# Patient Record
Sex: Female | Born: 1950 | Race: White | Hispanic: No | Marital: Married
Health system: Southern US, Community
[De-identification: ages and names within clinical notes are randomized; demographics above are authoritative.]

---

## 2007-11-14 ENCOUNTER — Encounter: Admission: RE | Admit: 2007-11-14 | Discharge: 2007-11-14 | Payer: Self-pay | Admitting: Family Medicine

## 2007-11-21 ENCOUNTER — Ambulatory Visit (HOSPITAL_COMMUNITY): Admission: RE | Admit: 2007-11-21 | Discharge: 2007-11-21 | Payer: Self-pay | Admitting: Family Medicine

## 2008-11-21 ENCOUNTER — Ambulatory Visit (HOSPITAL_COMMUNITY): Admission: RE | Admit: 2008-11-21 | Discharge: 2008-11-21 | Payer: Self-pay | Admitting: Family Medicine

## 2009-06-12 ENCOUNTER — Encounter: Admission: RE | Admit: 2009-06-12 | Discharge: 2009-06-12 | Payer: Self-pay | Admitting: Family Medicine

## 2009-11-25 ENCOUNTER — Ambulatory Visit (HOSPITAL_COMMUNITY): Admission: RE | Admit: 2009-11-25 | Discharge: 2009-11-25 | Payer: Self-pay | Admitting: Family Medicine

## 2009-12-01 ENCOUNTER — Encounter: Admission: RE | Admit: 2009-12-01 | Discharge: 2009-12-01 | Payer: Self-pay | Admitting: Family Medicine

## 2010-06-13 ENCOUNTER — Encounter: Payer: Self-pay | Admitting: Family Medicine

## 2010-10-29 ENCOUNTER — Other Ambulatory Visit (HOSPITAL_COMMUNITY): Payer: Self-pay | Admitting: Family Medicine

## 2010-10-29 DIAGNOSIS — Z1231 Encounter for screening mammogram for malignant neoplasm of breast: Secondary | ICD-10-CM

## 2010-11-29 ENCOUNTER — Ambulatory Visit (HOSPITAL_COMMUNITY)
Admission: RE | Admit: 2010-11-29 | Discharge: 2010-11-29 | Disposition: A | Discharge status: Home / Self Care | Source: Ambulatory Visit | Attending: Family Medicine | Admitting: Family Medicine

## 2010-11-29 DIAGNOSIS — Z1231 Encounter for screening mammogram for malignant neoplasm of breast: Secondary | ICD-10-CM | POA: Insufficient documentation

## 2011-10-24 ENCOUNTER — Other Ambulatory Visit (HOSPITAL_COMMUNITY): Payer: Self-pay | Admitting: Family Medicine

## 2011-10-24 DIAGNOSIS — Z1231 Encounter for screening mammogram for malignant neoplasm of breast: Secondary | ICD-10-CM

## 2011-12-01 ENCOUNTER — Ambulatory Visit (HOSPITAL_COMMUNITY)
Admission: RE | Admit: 2011-12-01 | Discharge: 2011-12-01 | Disposition: A | Discharge status: Home / Self Care | Source: Ambulatory Visit | Attending: Family Medicine | Admitting: Family Medicine

## 2011-12-01 DIAGNOSIS — Z1231 Encounter for screening mammogram for malignant neoplasm of breast: Secondary | ICD-10-CM | POA: Insufficient documentation

## 2011-12-07 ENCOUNTER — Other Ambulatory Visit: Payer: Self-pay | Admitting: Family Medicine

## 2011-12-07 DIAGNOSIS — R928 Other abnormal and inconclusive findings on diagnostic imaging of breast: Secondary | ICD-10-CM

## 2011-12-12 ENCOUNTER — Ambulatory Visit
Admission: RE | Admit: 2011-12-12 | Discharge: 2011-12-12 | Disposition: A | Discharge status: Home / Self Care | Source: Ambulatory Visit | Attending: Family Medicine | Admitting: Family Medicine

## 2011-12-12 ENCOUNTER — Other Ambulatory Visit: Payer: Self-pay | Admitting: Family Medicine

## 2011-12-12 DIAGNOSIS — R928 Other abnormal and inconclusive findings on diagnostic imaging of breast: Secondary | ICD-10-CM

## 2011-12-13 ENCOUNTER — Ambulatory Visit
Admission: RE | Admit: 2011-12-13 | Discharge: 2011-12-13 | Disposition: A | Discharge status: Home / Self Care | Source: Ambulatory Visit | Attending: Family Medicine | Admitting: Family Medicine

## 2011-12-13 DIAGNOSIS — R928 Other abnormal and inconclusive findings on diagnostic imaging of breast: Secondary | ICD-10-CM

## 2012-10-23 ENCOUNTER — Other Ambulatory Visit (HOSPITAL_COMMUNITY): Payer: Self-pay | Admitting: Family Medicine

## 2012-10-23 DIAGNOSIS — Z1231 Encounter for screening mammogram for malignant neoplasm of breast: Secondary | ICD-10-CM

## 2012-12-07 ENCOUNTER — Ambulatory Visit (HOSPITAL_COMMUNITY)

## 2012-12-14 ENCOUNTER — Ambulatory Visit (HOSPITAL_COMMUNITY)
Admission: RE | Admit: 2012-12-14 | Discharge: 2012-12-14 | Disposition: A | Discharge status: Home / Self Care | Source: Ambulatory Visit | Attending: Family Medicine | Admitting: Family Medicine

## 2012-12-14 ENCOUNTER — Other Ambulatory Visit (HOSPITAL_COMMUNITY): Payer: Self-pay | Admitting: Family Medicine

## 2012-12-14 DIAGNOSIS — Z1231 Encounter for screening mammogram for malignant neoplasm of breast: Secondary | ICD-10-CM

## 2013-11-12 ENCOUNTER — Other Ambulatory Visit (HOSPITAL_COMMUNITY): Payer: Self-pay | Admitting: Family Medicine

## 2013-11-12 DIAGNOSIS — Z1231 Encounter for screening mammogram for malignant neoplasm of breast: Secondary | ICD-10-CM

## 2013-12-16 ENCOUNTER — Ambulatory Visit (HOSPITAL_COMMUNITY)
Admission: RE | Admit: 2013-12-16 | Discharge: 2013-12-16 | Disposition: A | Discharge status: Home / Self Care | Source: Ambulatory Visit | Attending: Family Medicine | Admitting: Family Medicine

## 2013-12-16 DIAGNOSIS — Z1231 Encounter for screening mammogram for malignant neoplasm of breast: Secondary | ICD-10-CM | POA: Insufficient documentation

## 2014-11-07 ENCOUNTER — Other Ambulatory Visit (HOSPITAL_COMMUNITY): Payer: Self-pay | Admitting: Family Medicine

## 2014-11-07 DIAGNOSIS — Z1231 Encounter for screening mammogram for malignant neoplasm of breast: Secondary | ICD-10-CM

## 2014-12-19 ENCOUNTER — Ambulatory Visit (HOSPITAL_COMMUNITY)
Admission: RE | Admit: 2014-12-19 | Discharge: 2014-12-19 | Disposition: A | Discharge status: Home / Self Care | Source: Ambulatory Visit | Attending: Family Medicine | Admitting: Family Medicine

## 2014-12-19 DIAGNOSIS — Z1231 Encounter for screening mammogram for malignant neoplasm of breast: Secondary | ICD-10-CM | POA: Insufficient documentation

## 2015-06-30 DIAGNOSIS — I10 Essential (primary) hypertension: Secondary | ICD-10-CM | POA: Diagnosis not present

## 2015-06-30 DIAGNOSIS — E039 Hypothyroidism, unspecified: Secondary | ICD-10-CM | POA: Diagnosis not present

## 2015-07-01 DIAGNOSIS — E039 Hypothyroidism, unspecified: Secondary | ICD-10-CM | POA: Diagnosis not present

## 2015-07-01 DIAGNOSIS — E782 Mixed hyperlipidemia: Secondary | ICD-10-CM | POA: Diagnosis not present

## 2015-07-01 DIAGNOSIS — J3089 Other allergic rhinitis: Secondary | ICD-10-CM | POA: Diagnosis not present

## 2015-07-01 DIAGNOSIS — I10 Essential (primary) hypertension: Secondary | ICD-10-CM | POA: Diagnosis not present

## 2015-08-11 DIAGNOSIS — E782 Mixed hyperlipidemia: Secondary | ICD-10-CM | POA: Diagnosis not present

## 2015-08-11 DIAGNOSIS — E039 Hypothyroidism, unspecified: Secondary | ICD-10-CM | POA: Diagnosis not present

## 2015-08-11 DIAGNOSIS — I10 Essential (primary) hypertension: Secondary | ICD-10-CM | POA: Diagnosis not present

## 2015-08-11 DIAGNOSIS — J3089 Other allergic rhinitis: Secondary | ICD-10-CM | POA: Diagnosis not present

## 2015-11-17 ENCOUNTER — Other Ambulatory Visit: Payer: Self-pay | Admitting: Family Medicine

## 2015-11-17 DIAGNOSIS — Z1231 Encounter for screening mammogram for malignant neoplasm of breast: Secondary | ICD-10-CM

## 2016-01-27 ENCOUNTER — Ambulatory Visit
Admission: RE | Admit: 2016-01-27 | Discharge: 2016-01-27 | Disposition: A | Discharge status: Home / Self Care | Payer: Medicare Other | Source: Ambulatory Visit | Attending: Family Medicine | Admitting: Family Medicine

## 2016-01-27 DIAGNOSIS — Z1231 Encounter for screening mammogram for malignant neoplasm of breast: Secondary | ICD-10-CM | POA: Diagnosis not present

## 2016-02-12 DIAGNOSIS — E039 Hypothyroidism, unspecified: Secondary | ICD-10-CM | POA: Diagnosis not present

## 2016-02-16 ENCOUNTER — Other Ambulatory Visit: Payer: Self-pay | Admitting: Family Medicine

## 2016-02-16 DIAGNOSIS — M1990 Unspecified osteoarthritis, unspecified site: Secondary | ICD-10-CM | POA: Diagnosis not present

## 2016-02-16 DIAGNOSIS — E039 Hypothyroidism, unspecified: Secondary | ICD-10-CM | POA: Diagnosis not present

## 2016-02-16 DIAGNOSIS — I10 Essential (primary) hypertension: Secondary | ICD-10-CM | POA: Diagnosis not present

## 2016-02-16 DIAGNOSIS — M810 Age-related osteoporosis without current pathological fracture: Secondary | ICD-10-CM

## 2016-02-16 DIAGNOSIS — Z0000 Encounter for general adult medical examination without abnormal findings: Secondary | ICD-10-CM | POA: Diagnosis not present

## 2016-02-24 ENCOUNTER — Other Ambulatory Visit: Payer: Medicare Other

## 2016-03-01 ENCOUNTER — Ambulatory Visit
Admission: RE | Admit: 2016-03-01 | Discharge: 2016-03-01 | Disposition: A | Discharge status: Home / Self Care | Payer: Medicare Other | Source: Ambulatory Visit | Attending: Family Medicine | Admitting: Family Medicine

## 2016-03-01 DIAGNOSIS — Z780 Asymptomatic menopausal state: Secondary | ICD-10-CM | POA: Diagnosis not present

## 2016-03-01 DIAGNOSIS — M810 Age-related osteoporosis without current pathological fracture: Secondary | ICD-10-CM

## 2016-03-01 DIAGNOSIS — M85851 Other specified disorders of bone density and structure, right thigh: Secondary | ICD-10-CM | POA: Diagnosis not present

## 2016-05-20 DIAGNOSIS — E559 Vitamin D deficiency, unspecified: Secondary | ICD-10-CM | POA: Diagnosis not present

## 2016-05-20 DIAGNOSIS — R739 Hyperglycemia, unspecified: Secondary | ICD-10-CM | POA: Diagnosis not present

## 2016-05-25 DIAGNOSIS — E559 Vitamin D deficiency, unspecified: Secondary | ICD-10-CM | POA: Diagnosis not present

## 2016-05-25 DIAGNOSIS — M1990 Unspecified osteoarthritis, unspecified site: Secondary | ICD-10-CM | POA: Diagnosis not present

## 2016-05-25 DIAGNOSIS — M8580 Other specified disorders of bone density and structure, unspecified site: Secondary | ICD-10-CM | POA: Diagnosis not present

## 2016-05-25 DIAGNOSIS — I10 Essential (primary) hypertension: Secondary | ICD-10-CM | POA: Diagnosis not present

## 2016-06-24 DIAGNOSIS — H04123 Dry eye syndrome of bilateral lacrimal glands: Secondary | ICD-10-CM | POA: Diagnosis not present

## 2016-08-19 DIAGNOSIS — Z6837 Body mass index (BMI) 37.0-37.9, adult: Secondary | ICD-10-CM | POA: Diagnosis not present

## 2016-08-19 DIAGNOSIS — K219 Gastro-esophageal reflux disease without esophagitis: Secondary | ICD-10-CM | POA: Diagnosis not present

## 2016-08-19 DIAGNOSIS — I10 Essential (primary) hypertension: Secondary | ICD-10-CM | POA: Diagnosis not present

## 2016-08-19 DIAGNOSIS — E782 Mixed hyperlipidemia: Secondary | ICD-10-CM | POA: Diagnosis not present

## 2016-08-19 DIAGNOSIS — Z136 Encounter for screening for cardiovascular disorders: Secondary | ICD-10-CM | POA: Diagnosis not present

## 2016-12-13 DIAGNOSIS — E039 Hypothyroidism, unspecified: Secondary | ICD-10-CM | POA: Diagnosis not present

## 2016-12-16 DIAGNOSIS — E039 Hypothyroidism, unspecified: Secondary | ICD-10-CM | POA: Diagnosis not present

## 2016-12-16 DIAGNOSIS — I10 Essential (primary) hypertension: Secondary | ICD-10-CM | POA: Diagnosis not present

## 2016-12-16 DIAGNOSIS — Z6837 Body mass index (BMI) 37.0-37.9, adult: Secondary | ICD-10-CM | POA: Diagnosis not present

## 2016-12-16 DIAGNOSIS — K219 Gastro-esophageal reflux disease without esophagitis: Secondary | ICD-10-CM | POA: Diagnosis not present

## 2017-02-14 DIAGNOSIS — Z23 Encounter for immunization: Secondary | ICD-10-CM | POA: Diagnosis not present

## 2017-02-17 DIAGNOSIS — Z23 Encounter for immunization: Secondary | ICD-10-CM | POA: Diagnosis not present

## 2017-02-17 DIAGNOSIS — F411 Generalized anxiety disorder: Secondary | ICD-10-CM | POA: Diagnosis not present

## 2017-02-17 DIAGNOSIS — Z0000 Encounter for general adult medical examination without abnormal findings: Secondary | ICD-10-CM | POA: Diagnosis not present

## 2017-02-17 DIAGNOSIS — I10 Essential (primary) hypertension: Secondary | ICD-10-CM | POA: Diagnosis not present

## 2017-02-17 DIAGNOSIS — Z6837 Body mass index (BMI) 37.0-37.9, adult: Secondary | ICD-10-CM | POA: Diagnosis not present

## 2017-02-17 DIAGNOSIS — K219 Gastro-esophageal reflux disease without esophagitis: Secondary | ICD-10-CM | POA: Diagnosis not present

## 2017-03-31 DIAGNOSIS — Z23 Encounter for immunization: Secondary | ICD-10-CM | POA: Diagnosis not present

## 2017-06-19 DIAGNOSIS — E039 Hypothyroidism, unspecified: Secondary | ICD-10-CM | POA: Diagnosis not present

## 2017-06-19 DIAGNOSIS — E559 Vitamin D deficiency, unspecified: Secondary | ICD-10-CM | POA: Diagnosis not present

## 2017-06-19 DIAGNOSIS — Z79899 Other long term (current) drug therapy: Secondary | ICD-10-CM | POA: Diagnosis not present

## 2017-06-19 DIAGNOSIS — E782 Mixed hyperlipidemia: Secondary | ICD-10-CM | POA: Diagnosis not present

## 2017-06-21 DIAGNOSIS — Z23 Encounter for immunization: Secondary | ICD-10-CM | POA: Diagnosis not present

## 2017-06-21 DIAGNOSIS — Z6837 Body mass index (BMI) 37.0-37.9, adult: Secondary | ICD-10-CM | POA: Diagnosis not present

## 2017-06-21 DIAGNOSIS — E039 Hypothyroidism, unspecified: Secondary | ICD-10-CM | POA: Diagnosis not present

## 2017-06-21 DIAGNOSIS — E559 Vitamin D deficiency, unspecified: Secondary | ICD-10-CM | POA: Diagnosis not present

## 2017-06-21 DIAGNOSIS — E782 Mixed hyperlipidemia: Secondary | ICD-10-CM | POA: Diagnosis not present

## 2017-09-07 DIAGNOSIS — Z6837 Body mass index (BMI) 37.0-37.9, adult: Secondary | ICD-10-CM | POA: Diagnosis not present

## 2017-09-07 DIAGNOSIS — M1990 Unspecified osteoarthritis, unspecified site: Secondary | ICD-10-CM | POA: Diagnosis not present

## 2017-11-28 DIAGNOSIS — E039 Hypothyroidism, unspecified: Secondary | ICD-10-CM | POA: Diagnosis not present

## 2017-11-28 DIAGNOSIS — E559 Vitamin D deficiency, unspecified: Secondary | ICD-10-CM | POA: Diagnosis not present

## 2017-11-29 DIAGNOSIS — I10 Essential (primary) hypertension: Secondary | ICD-10-CM | POA: Diagnosis not present

## 2017-11-29 DIAGNOSIS — K219 Gastro-esophageal reflux disease without esophagitis: Secondary | ICD-10-CM | POA: Diagnosis not present

## 2017-11-29 DIAGNOSIS — E559 Vitamin D deficiency, unspecified: Secondary | ICD-10-CM | POA: Diagnosis not present

## 2017-11-29 DIAGNOSIS — Z6838 Body mass index (BMI) 38.0-38.9, adult: Secondary | ICD-10-CM | POA: Diagnosis not present

## 2017-11-29 DIAGNOSIS — E039 Hypothyroidism, unspecified: Secondary | ICD-10-CM | POA: Diagnosis not present

## 2017-12-25 ENCOUNTER — Other Ambulatory Visit: Payer: Self-pay

## 2017-12-28 DIAGNOSIS — H2513 Age-related nuclear cataract, bilateral: Secondary | ICD-10-CM | POA: Diagnosis not present

## 2018-01-23 ENCOUNTER — Other Ambulatory Visit: Payer: Self-pay | Admitting: Family Medicine

## 2018-01-23 DIAGNOSIS — Z1231 Encounter for screening mammogram for malignant neoplasm of breast: Secondary | ICD-10-CM

## 2018-02-20 ENCOUNTER — Ambulatory Visit
Admission: RE | Admit: 2018-02-20 | Discharge: 2018-02-20 | Disposition: A | Discharge status: Home / Self Care | Payer: Medicare Other | Source: Ambulatory Visit | Attending: Family Medicine | Admitting: Family Medicine

## 2018-02-20 DIAGNOSIS — Z1231 Encounter for screening mammogram for malignant neoplasm of breast: Secondary | ICD-10-CM | POA: Diagnosis not present

## 2018-02-27 DIAGNOSIS — Z0000 Encounter for general adult medical examination without abnormal findings: Secondary | ICD-10-CM | POA: Diagnosis not present

## 2018-02-28 ENCOUNTER — Other Ambulatory Visit: Payer: Self-pay | Admitting: Family Medicine

## 2018-02-28 DIAGNOSIS — E2839 Other primary ovarian failure: Secondary | ICD-10-CM

## 2018-06-12 DIAGNOSIS — E039 Hypothyroidism, unspecified: Secondary | ICD-10-CM | POA: Diagnosis not present

## 2018-06-12 DIAGNOSIS — I10 Essential (primary) hypertension: Secondary | ICD-10-CM | POA: Diagnosis not present

## 2018-06-12 DIAGNOSIS — E782 Mixed hyperlipidemia: Secondary | ICD-10-CM | POA: Diagnosis not present

## 2018-06-19 DIAGNOSIS — E039 Hypothyroidism, unspecified: Secondary | ICD-10-CM | POA: Diagnosis not present

## 2018-06-19 DIAGNOSIS — M8580 Other specified disorders of bone density and structure, unspecified site: Secondary | ICD-10-CM | POA: Diagnosis not present

## 2018-06-19 DIAGNOSIS — Z6839 Body mass index (BMI) 39.0-39.9, adult: Secondary | ICD-10-CM | POA: Diagnosis not present

## 2018-06-19 DIAGNOSIS — E782 Mixed hyperlipidemia: Secondary | ICD-10-CM | POA: Diagnosis not present

## 2018-06-19 DIAGNOSIS — I10 Essential (primary) hypertension: Secondary | ICD-10-CM | POA: Diagnosis not present

## 2018-06-19 DIAGNOSIS — K219 Gastro-esophageal reflux disease without esophagitis: Secondary | ICD-10-CM | POA: Diagnosis not present

## 2018-08-14 ENCOUNTER — Other Ambulatory Visit: Payer: Medicare Other

## 2018-08-16 ENCOUNTER — Other Ambulatory Visit: Payer: Medicare Other

## 2018-09-18 ENCOUNTER — Other Ambulatory Visit: Payer: Medicare Other

## 2018-10-30 DIAGNOSIS — M71572 Other bursitis, not elsewhere classified, left ankle and foot: Secondary | ICD-10-CM | POA: Diagnosis not present

## 2018-10-30 DIAGNOSIS — M7662 Achilles tendinitis, left leg: Secondary | ICD-10-CM | POA: Diagnosis not present

## 2018-10-30 DIAGNOSIS — M7732 Calcaneal spur, left foot: Secondary | ICD-10-CM | POA: Diagnosis not present

## 2018-11-06 DIAGNOSIS — M7662 Achilles tendinitis, left leg: Secondary | ICD-10-CM | POA: Diagnosis not present

## 2018-11-27 DIAGNOSIS — M7662 Achilles tendinitis, left leg: Secondary | ICD-10-CM | POA: Diagnosis not present

## 2018-12-11 DIAGNOSIS — M7662 Achilles tendinitis, left leg: Secondary | ICD-10-CM | POA: Diagnosis not present

## 2018-12-11 DIAGNOSIS — M71572 Other bursitis, not elsewhere classified, left ankle and foot: Secondary | ICD-10-CM | POA: Diagnosis not present

## 2018-12-25 DIAGNOSIS — M7662 Achilles tendinitis, left leg: Secondary | ICD-10-CM | POA: Diagnosis not present

## 2018-12-25 DIAGNOSIS — M71572 Other bursitis, not elsewhere classified, left ankle and foot: Secondary | ICD-10-CM | POA: Diagnosis not present

## 2019-01-07 DIAGNOSIS — E039 Hypothyroidism, unspecified: Secondary | ICD-10-CM | POA: Diagnosis not present

## 2019-01-07 DIAGNOSIS — E782 Mixed hyperlipidemia: Secondary | ICD-10-CM | POA: Diagnosis not present

## 2019-01-07 DIAGNOSIS — E559 Vitamin D deficiency, unspecified: Secondary | ICD-10-CM | POA: Diagnosis not present

## 2019-01-07 DIAGNOSIS — I10 Essential (primary) hypertension: Secondary | ICD-10-CM | POA: Diagnosis not present

## 2019-01-11 DIAGNOSIS — E559 Vitamin D deficiency, unspecified: Secondary | ICD-10-CM | POA: Diagnosis not present

## 2019-01-11 DIAGNOSIS — E782 Mixed hyperlipidemia: Secondary | ICD-10-CM | POA: Diagnosis not present

## 2019-01-11 DIAGNOSIS — E039 Hypothyroidism, unspecified: Secondary | ICD-10-CM | POA: Diagnosis not present

## 2019-01-11 DIAGNOSIS — K219 Gastro-esophageal reflux disease without esophagitis: Secondary | ICD-10-CM | POA: Diagnosis not present

## 2019-03-05 DIAGNOSIS — I10 Essential (primary) hypertension: Secondary | ICD-10-CM | POA: Diagnosis not present

## 2019-03-05 DIAGNOSIS — K219 Gastro-esophageal reflux disease without esophagitis: Secondary | ICD-10-CM | POA: Diagnosis not present

## 2019-03-05 DIAGNOSIS — J3089 Other allergic rhinitis: Secondary | ICD-10-CM | POA: Diagnosis not present

## 2019-03-05 DIAGNOSIS — M722 Plantar fascial fibromatosis: Secondary | ICD-10-CM | POA: Diagnosis not present

## 2019-03-05 DIAGNOSIS — Z0000 Encounter for general adult medical examination without abnormal findings: Secondary | ICD-10-CM | POA: Diagnosis not present

## 2019-04-16 ENCOUNTER — Other Ambulatory Visit: Payer: Self-pay

## 2019-07-01 ENCOUNTER — Ambulatory Visit: Payer: Medicare Other | Attending: Internal Medicine

## 2019-07-01 DIAGNOSIS — Z23 Encounter for immunization: Secondary | ICD-10-CM

## 2019-07-01 NOTE — Progress Notes (Signed)
   Covid-19 Vaccination Clinic  Name:  Mackenzie Jenkins    MRN: [id number] DOB: [birth year]  07/01/2019  [name] was observed post Covid-19 immunization for 15 minutes without incidence. She was provided with Vaccine Information Sheet and instruction to access the V-Safe system.   [name] was instructed to call 911 with any severe reactions post vaccine: Marland Kitchen Difficulty breathing  . Swelling of your face and throat  . A fast heartbeat  . A bad rash all over your body  . Dizziness and weakness    Immunizations Administered    Name Date Dose VIS Date Route   Pfizer COVID-19 Vaccine 07/01/2019 11:01 AM 0.3 mL 05/03/2019 Intramuscular   Manufacturer: [employer], [employer]   Lot: [id number]   NDC: [address]-6

## 2019-07-20 ENCOUNTER — Ambulatory Visit: Payer: Medicare Other

## 2019-07-26 ENCOUNTER — Ambulatory Visit: Payer: Medicare Other | Attending: Internal Medicine

## 2019-07-26 DIAGNOSIS — Z23 Encounter for immunization: Secondary | ICD-10-CM | POA: Insufficient documentation

## 2019-07-26 NOTE — Progress Notes (Signed)
   Covid-19 Vaccination Clinic  Name:  Mackenzie Jenkins    MRN: [id number] DOB: 1950/06/10  07/26/2019  [name] was observed post Covid-19 immunization for 15 minutes without incident. She was provided with Vaccine Information Sheet and instruction to access the V-Safe system.   [name] was instructed to call 911 with any severe reactions post vaccine: Marland Kitchen Difficulty breathing  . Swelling of face and throat  . A fast heartbeat  . A bad rash all over body  . Dizziness and weakness   Immunizations Administered    Name Date Dose VIS Date Route   Pfizer COVID-19 Vaccine 07/26/2019  9:44 AM 0.3 mL 05/03/2019 Intramuscular   Manufacturer: [employer], [employer]   Lot: [id number]   NDC: [address]-1

## 2019-10-22 DIAGNOSIS — I10 Essential (primary) hypertension: Secondary | ICD-10-CM | POA: Diagnosis not present

## 2019-10-22 DIAGNOSIS — E559 Vitamin D deficiency, unspecified: Secondary | ICD-10-CM | POA: Diagnosis not present

## 2019-10-22 DIAGNOSIS — E782 Mixed hyperlipidemia: Secondary | ICD-10-CM | POA: Diagnosis not present

## 2019-10-22 DIAGNOSIS — E039 Hypothyroidism, unspecified: Secondary | ICD-10-CM | POA: Diagnosis not present

## 2019-10-22 DIAGNOSIS — E1165 Type 2 diabetes mellitus with hyperglycemia: Secondary | ICD-10-CM | POA: Diagnosis not present

## 2019-11-05 DIAGNOSIS — E039 Hypothyroidism, unspecified: Secondary | ICD-10-CM | POA: Diagnosis not present

## 2019-11-05 DIAGNOSIS — H5212 Myopia, left eye: Secondary | ICD-10-CM | POA: Diagnosis not present

## 2019-11-05 DIAGNOSIS — E1165 Type 2 diabetes mellitus with hyperglycemia: Secondary | ICD-10-CM | POA: Diagnosis not present

## 2019-11-05 DIAGNOSIS — M170 Bilateral primary osteoarthritis of knee: Secondary | ICD-10-CM | POA: Diagnosis not present

## 2019-11-05 DIAGNOSIS — M19041 Primary osteoarthritis, right hand: Secondary | ICD-10-CM | POA: Diagnosis not present

## 2019-11-05 DIAGNOSIS — H52223 Regular astigmatism, bilateral: Secondary | ICD-10-CM | POA: Diagnosis not present

## 2019-11-05 DIAGNOSIS — E782 Mixed hyperlipidemia: Secondary | ICD-10-CM | POA: Diagnosis not present

## 2019-11-05 DIAGNOSIS — M19042 Primary osteoarthritis, left hand: Secondary | ICD-10-CM | POA: Diagnosis not present

## 2019-11-05 DIAGNOSIS — K279 Peptic ulcer, site unspecified, unspecified as acute or chronic, without hemorrhage or perforation: Secondary | ICD-10-CM | POA: Diagnosis not present

## 2019-11-05 DIAGNOSIS — I152 Hypertension secondary to endocrine disorders: Secondary | ICD-10-CM | POA: Diagnosis not present

## 2019-11-05 DIAGNOSIS — H2513 Age-related nuclear cataract, bilateral: Secondary | ICD-10-CM | POA: Diagnosis not present

## 2019-11-05 DIAGNOSIS — H04123 Dry eye syndrome of bilateral lacrimal glands: Secondary | ICD-10-CM | POA: Diagnosis not present

## 2020-02-18 DIAGNOSIS — Z23 Encounter for immunization: Secondary | ICD-10-CM | POA: Diagnosis not present

## 2020-03-23 DIAGNOSIS — I10 Essential (primary) hypertension: Secondary | ICD-10-CM | POA: Diagnosis not present

## 2020-03-23 DIAGNOSIS — M19049 Primary osteoarthritis, unspecified hand: Secondary | ICD-10-CM | POA: Diagnosis not present

## 2020-03-23 DIAGNOSIS — Z0000 Encounter for general adult medical examination without abnormal findings: Secondary | ICD-10-CM | POA: Diagnosis not present

## 2020-03-23 DIAGNOSIS — Z1339 Encounter for screening examination for other mental health and behavioral disorders: Secondary | ICD-10-CM | POA: Diagnosis not present

## 2020-03-23 DIAGNOSIS — Z1331 Encounter for screening for depression: Secondary | ICD-10-CM | POA: Diagnosis not present

## 2020-03-23 DIAGNOSIS — K219 Gastro-esophageal reflux disease without esophagitis: Secondary | ICD-10-CM | POA: Diagnosis not present

## 2020-04-03 ENCOUNTER — Other Ambulatory Visit: Payer: Self-pay | Admitting: Family Medicine

## 2020-04-03 DIAGNOSIS — M8580 Other specified disorders of bone density and structure, unspecified site: Secondary | ICD-10-CM

## 2020-04-03 DIAGNOSIS — E2839 Other primary ovarian failure: Secondary | ICD-10-CM

## 2020-04-13 ENCOUNTER — Other Ambulatory Visit: Payer: Self-pay | Admitting: Family Medicine

## 2020-04-13 DIAGNOSIS — Z1231 Encounter for screening mammogram for malignant neoplasm of breast: Secondary | ICD-10-CM

## 2020-04-13 DIAGNOSIS — M8580 Other specified disorders of bone density and structure, unspecified site: Secondary | ICD-10-CM

## 2020-04-13 DIAGNOSIS — E2839 Other primary ovarian failure: Secondary | ICD-10-CM

## 2020-09-21 ENCOUNTER — Ambulatory Visit
Admission: RE | Admit: 2020-09-21 | Discharge: 2020-09-21 | Disposition: A | Discharge status: Home / Self Care | Payer: Medicare Other | Source: Ambulatory Visit | Attending: Family Medicine | Admitting: Family Medicine

## 2020-09-21 ENCOUNTER — Other Ambulatory Visit: Payer: Self-pay

## 2020-09-21 DIAGNOSIS — M8580 Other specified disorders of bone density and structure, unspecified site: Secondary | ICD-10-CM

## 2020-09-21 DIAGNOSIS — M85851 Other specified disorders of bone density and structure, right thigh: Secondary | ICD-10-CM | POA: Diagnosis not present

## 2020-09-21 DIAGNOSIS — Z780 Asymptomatic menopausal state: Secondary | ICD-10-CM | POA: Diagnosis not present

## 2020-09-21 DIAGNOSIS — E2839 Other primary ovarian failure: Secondary | ICD-10-CM

## 2020-09-21 DIAGNOSIS — Z1231 Encounter for screening mammogram for malignant neoplasm of breast: Secondary | ICD-10-CM

## 2020-10-26 DIAGNOSIS — E559 Vitamin D deficiency, unspecified: Secondary | ICD-10-CM | POA: Diagnosis not present

## 2020-10-26 DIAGNOSIS — I10 Essential (primary) hypertension: Secondary | ICD-10-CM | POA: Diagnosis not present

## 2020-10-26 DIAGNOSIS — E039 Hypothyroidism, unspecified: Secondary | ICD-10-CM | POA: Diagnosis not present

## 2020-11-04 DIAGNOSIS — E559 Vitamin D deficiency, unspecified: Secondary | ICD-10-CM | POA: Diagnosis not present

## 2020-11-04 DIAGNOSIS — I10 Essential (primary) hypertension: Secondary | ICD-10-CM | POA: Diagnosis not present

## 2020-11-04 DIAGNOSIS — E782 Mixed hyperlipidemia: Secondary | ICD-10-CM | POA: Diagnosis not present

## 2020-11-04 DIAGNOSIS — E039 Hypothyroidism, unspecified: Secondary | ICD-10-CM | POA: Diagnosis not present

## 2021-01-22 DIAGNOSIS — E782 Mixed hyperlipidemia: Secondary | ICD-10-CM | POA: Diagnosis not present

## 2021-01-22 DIAGNOSIS — E1165 Type 2 diabetes mellitus with hyperglycemia: Secondary | ICD-10-CM | POA: Diagnosis not present

## 2021-02-02 DIAGNOSIS — E782 Mixed hyperlipidemia: Secondary | ICD-10-CM | POA: Diagnosis not present

## 2021-02-02 DIAGNOSIS — E1165 Type 2 diabetes mellitus with hyperglycemia: Secondary | ICD-10-CM | POA: Diagnosis not present

## 2021-02-02 DIAGNOSIS — I10 Essential (primary) hypertension: Secondary | ICD-10-CM | POA: Diagnosis not present

## 2021-02-02 DIAGNOSIS — K219 Gastro-esophageal reflux disease without esophagitis: Secondary | ICD-10-CM | POA: Diagnosis not present

## 2021-03-30 DIAGNOSIS — Z1331 Encounter for screening for depression: Secondary | ICD-10-CM | POA: Diagnosis not present

## 2021-03-30 DIAGNOSIS — Z1339 Encounter for screening examination for other mental health and behavioral disorders: Secondary | ICD-10-CM | POA: Diagnosis not present

## 2021-03-30 DIAGNOSIS — M19041 Primary osteoarthritis, right hand: Secondary | ICD-10-CM | POA: Diagnosis not present

## 2021-03-30 DIAGNOSIS — M170 Bilateral primary osteoarthritis of knee: Secondary | ICD-10-CM | POA: Diagnosis not present

## 2021-03-30 DIAGNOSIS — M19042 Primary osteoarthritis, left hand: Secondary | ICD-10-CM | POA: Diagnosis not present

## 2021-03-30 DIAGNOSIS — Z0000 Encounter for general adult medical examination without abnormal findings: Secondary | ICD-10-CM | POA: Diagnosis not present

## 2021-05-07 DIAGNOSIS — M13842 Other specified arthritis, left hand: Secondary | ICD-10-CM | POA: Diagnosis not present

## 2021-05-19 DIAGNOSIS — H2513 Age-related nuclear cataract, bilateral: Secondary | ICD-10-CM | POA: Diagnosis not present

## 2021-05-19 DIAGNOSIS — H04123 Dry eye syndrome of bilateral lacrimal glands: Secondary | ICD-10-CM | POA: Diagnosis not present

## 2021-05-19 DIAGNOSIS — H52223 Regular astigmatism, bilateral: Secondary | ICD-10-CM | POA: Diagnosis not present

## 2021-05-19 DIAGNOSIS — H5213 Myopia, bilateral: Secondary | ICD-10-CM | POA: Diagnosis not present

## 2021-05-31 DIAGNOSIS — E559 Vitamin D deficiency, unspecified: Secondary | ICD-10-CM | POA: Diagnosis not present

## 2021-05-31 DIAGNOSIS — E119 Type 2 diabetes mellitus without complications: Secondary | ICD-10-CM | POA: Diagnosis not present

## 2021-05-31 DIAGNOSIS — E039 Hypothyroidism, unspecified: Secondary | ICD-10-CM | POA: Diagnosis not present

## 2021-06-08 DIAGNOSIS — M19042 Primary osteoarthritis, left hand: Secondary | ICD-10-CM | POA: Diagnosis not present

## 2021-06-08 DIAGNOSIS — E1165 Type 2 diabetes mellitus with hyperglycemia: Secondary | ICD-10-CM | POA: Diagnosis not present

## 2021-06-08 DIAGNOSIS — M19041 Primary osteoarthritis, right hand: Secondary | ICD-10-CM | POA: Diagnosis not present

## 2021-06-08 DIAGNOSIS — E039 Hypothyroidism, unspecified: Secondary | ICD-10-CM | POA: Diagnosis not present

## 2021-06-08 DIAGNOSIS — K219 Gastro-esophageal reflux disease without esophagitis: Secondary | ICD-10-CM | POA: Diagnosis not present

## 2021-06-08 DIAGNOSIS — E559 Vitamin D deficiency, unspecified: Secondary | ICD-10-CM | POA: Diagnosis not present

## 2021-06-08 DIAGNOSIS — I10 Essential (primary) hypertension: Secondary | ICD-10-CM | POA: Diagnosis not present

## 2021-06-10 DIAGNOSIS — M13842 Other specified arthritis, left hand: Secondary | ICD-10-CM | POA: Diagnosis not present

## 2021-06-25 DIAGNOSIS — H5201 Hypermetropia, right eye: Secondary | ICD-10-CM | POA: Diagnosis not present

## 2021-06-25 DIAGNOSIS — H2513 Age-related nuclear cataract, bilateral: Secondary | ICD-10-CM | POA: Diagnosis not present

## 2021-06-25 DIAGNOSIS — H524 Presbyopia: Secondary | ICD-10-CM | POA: Diagnosis not present

## 2021-06-25 DIAGNOSIS — H04123 Dry eye syndrome of bilateral lacrimal glands: Secondary | ICD-10-CM | POA: Diagnosis not present

## 2021-06-25 DIAGNOSIS — H52223 Regular astigmatism, bilateral: Secondary | ICD-10-CM | POA: Diagnosis not present

## 2021-07-09 DIAGNOSIS — J309 Allergic rhinitis, unspecified: Secondary | ICD-10-CM | POA: Diagnosis not present

## 2022-01-17 DIAGNOSIS — M19041 Primary osteoarthritis, right hand: Secondary | ICD-10-CM | POA: Diagnosis not present

## 2022-01-17 DIAGNOSIS — J309 Allergic rhinitis, unspecified: Secondary | ICD-10-CM | POA: Diagnosis not present

## 2022-01-17 DIAGNOSIS — M19042 Primary osteoarthritis, left hand: Secondary | ICD-10-CM | POA: Diagnosis not present

## 2022-01-17 DIAGNOSIS — I10 Essential (primary) hypertension: Secondary | ICD-10-CM | POA: Diagnosis not present

## 2022-02-28 DIAGNOSIS — I10 Essential (primary) hypertension: Secondary | ICD-10-CM | POA: Diagnosis not present

## 2022-02-28 DIAGNOSIS — M19041 Primary osteoarthritis, right hand: Secondary | ICD-10-CM | POA: Diagnosis not present

## 2022-02-28 DIAGNOSIS — M19042 Primary osteoarthritis, left hand: Secondary | ICD-10-CM | POA: Diagnosis not present

## 2022-02-28 DIAGNOSIS — M170 Bilateral primary osteoarthritis of knee: Secondary | ICD-10-CM | POA: Diagnosis not present

## 2022-03-21 DIAGNOSIS — E782 Mixed hyperlipidemia: Secondary | ICD-10-CM | POA: Diagnosis not present

## 2022-03-21 DIAGNOSIS — I10 Essential (primary) hypertension: Secondary | ICD-10-CM | POA: Diagnosis not present

## 2022-03-21 DIAGNOSIS — E039 Hypothyroidism, unspecified: Secondary | ICD-10-CM | POA: Diagnosis not present

## 2022-03-21 DIAGNOSIS — E559 Vitamin D deficiency, unspecified: Secondary | ICD-10-CM | POA: Diagnosis not present

## 2022-03-21 DIAGNOSIS — E1165 Type 2 diabetes mellitus with hyperglycemia: Secondary | ICD-10-CM | POA: Diagnosis not present

## 2022-04-08 DIAGNOSIS — E782 Mixed hyperlipidemia: Secondary | ICD-10-CM | POA: Diagnosis not present

## 2022-04-08 DIAGNOSIS — E039 Hypothyroidism, unspecified: Secondary | ICD-10-CM | POA: Diagnosis not present

## 2022-04-08 DIAGNOSIS — R7303 Prediabetes: Secondary | ICD-10-CM | POA: Diagnosis not present

## 2022-04-08 DIAGNOSIS — Z1331 Encounter for screening for depression: Secondary | ICD-10-CM | POA: Diagnosis not present

## 2022-04-08 DIAGNOSIS — M8580 Other specified disorders of bone density and structure, unspecified site: Secondary | ICD-10-CM | POA: Diagnosis not present

## 2022-04-08 DIAGNOSIS — Z0000 Encounter for general adult medical examination without abnormal findings: Secondary | ICD-10-CM | POA: Diagnosis not present

## 2022-04-08 DIAGNOSIS — Z1339 Encounter for screening examination for other mental health and behavioral disorders: Secondary | ICD-10-CM | POA: Diagnosis not present

## 2022-04-08 DIAGNOSIS — R7401 Elevation of levels of liver transaminase levels: Secondary | ICD-10-CM | POA: Diagnosis not present

## 2022-08-05 DIAGNOSIS — R7401 Elevation of levels of liver transaminase levels: Secondary | ICD-10-CM | POA: Diagnosis not present

## 2022-08-05 DIAGNOSIS — R7303 Prediabetes: Secondary | ICD-10-CM | POA: Diagnosis not present

## 2022-08-05 DIAGNOSIS — E1165 Type 2 diabetes mellitus with hyperglycemia: Secondary | ICD-10-CM | POA: Diagnosis not present

## 2022-08-15 DIAGNOSIS — R7989 Other specified abnormal findings of blood chemistry: Secondary | ICD-10-CM | POA: Diagnosis not present

## 2022-08-15 DIAGNOSIS — I10 Essential (primary) hypertension: Secondary | ICD-10-CM | POA: Diagnosis not present

## 2022-08-15 DIAGNOSIS — E119 Type 2 diabetes mellitus without complications: Secondary | ICD-10-CM | POA: Diagnosis not present

## 2022-08-15 DIAGNOSIS — R7303 Prediabetes: Secondary | ICD-10-CM | POA: Diagnosis not present

## 2022-08-15 DIAGNOSIS — R7401 Elevation of levels of liver transaminase levels: Secondary | ICD-10-CM | POA: Diagnosis not present

## 2022-09-16 DIAGNOSIS — H52223 Regular astigmatism, bilateral: Secondary | ICD-10-CM | POA: Diagnosis not present

## 2022-09-16 DIAGNOSIS — H35033 Hypertensive retinopathy, bilateral: Secondary | ICD-10-CM | POA: Diagnosis not present

## 2022-09-16 DIAGNOSIS — H5201 Hypermetropia, right eye: Secondary | ICD-10-CM | POA: Diagnosis not present

## 2022-09-16 DIAGNOSIS — H524 Presbyopia: Secondary | ICD-10-CM | POA: Diagnosis not present

## 2022-09-16 DIAGNOSIS — H04123 Dry eye syndrome of bilateral lacrimal glands: Secondary | ICD-10-CM | POA: Diagnosis not present

## 2022-09-16 DIAGNOSIS — H5212 Myopia, left eye: Secondary | ICD-10-CM | POA: Diagnosis not present

## 2022-09-16 DIAGNOSIS — H2513 Age-related nuclear cataract, bilateral: Secondary | ICD-10-CM | POA: Diagnosis not present

## 2022-10-31 DIAGNOSIS — E119 Type 2 diabetes mellitus without complications: Secondary | ICD-10-CM | POA: Diagnosis not present

## 2022-11-11 DIAGNOSIS — I10 Essential (primary) hypertension: Secondary | ICD-10-CM | POA: Diagnosis not present

## 2022-11-11 DIAGNOSIS — R7303 Prediabetes: Secondary | ICD-10-CM | POA: Diagnosis not present

## 2023-01-08 IMAGING — MG MM DIGITAL SCREENING BILAT W/ TOMO AND CAD
6 of 10 series · 6 of 30 positions shown · non-contrast
Comparison: Previous exam(s).

CLINICAL DATA: Screening.

EXAM:
DIGITAL SCREENING BILATERAL MAMMOGRAM WITH TOMOSYNTHESIS AND CAD
TECHNIQUE: Bilateral screening digital craniocaudal and mediolateral oblique
mammograms were obtained. Bilateral screening digital breast
tomosynthesis was performed. The images were evaluated with
computer-aided detection.

[R MLO synth-2D]
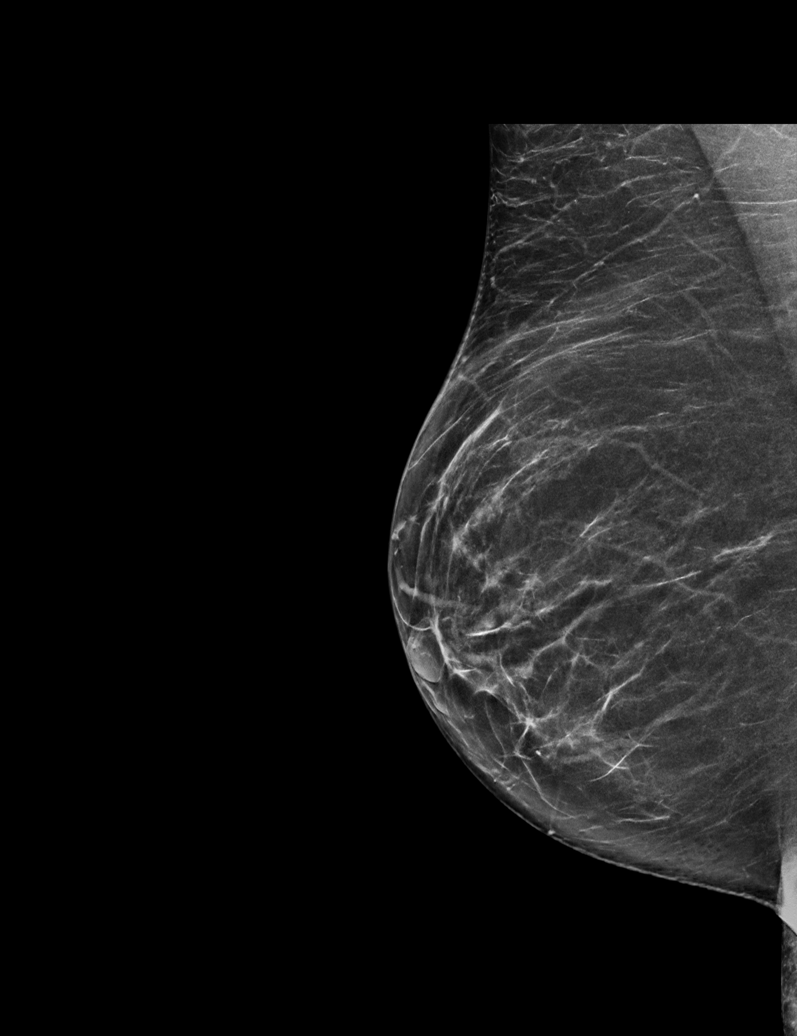

[L CC synth-2D]
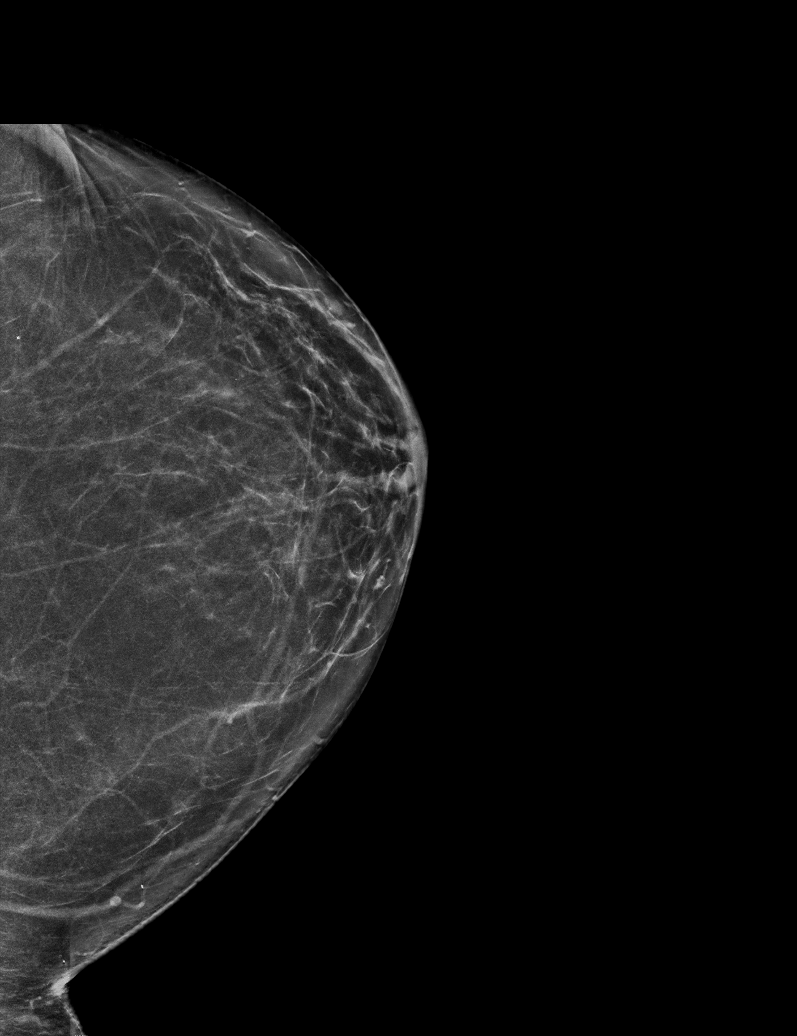

[R CC synth-2D (1 of 2)]
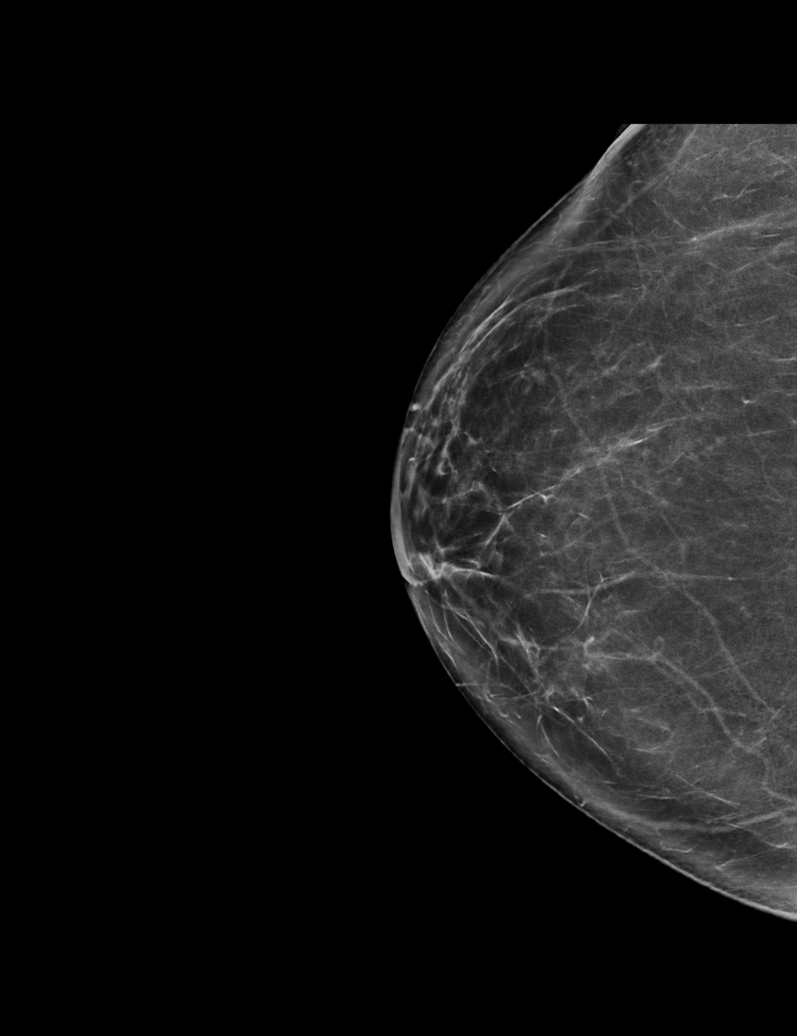

[R CC synth-2D (2 of 2)]
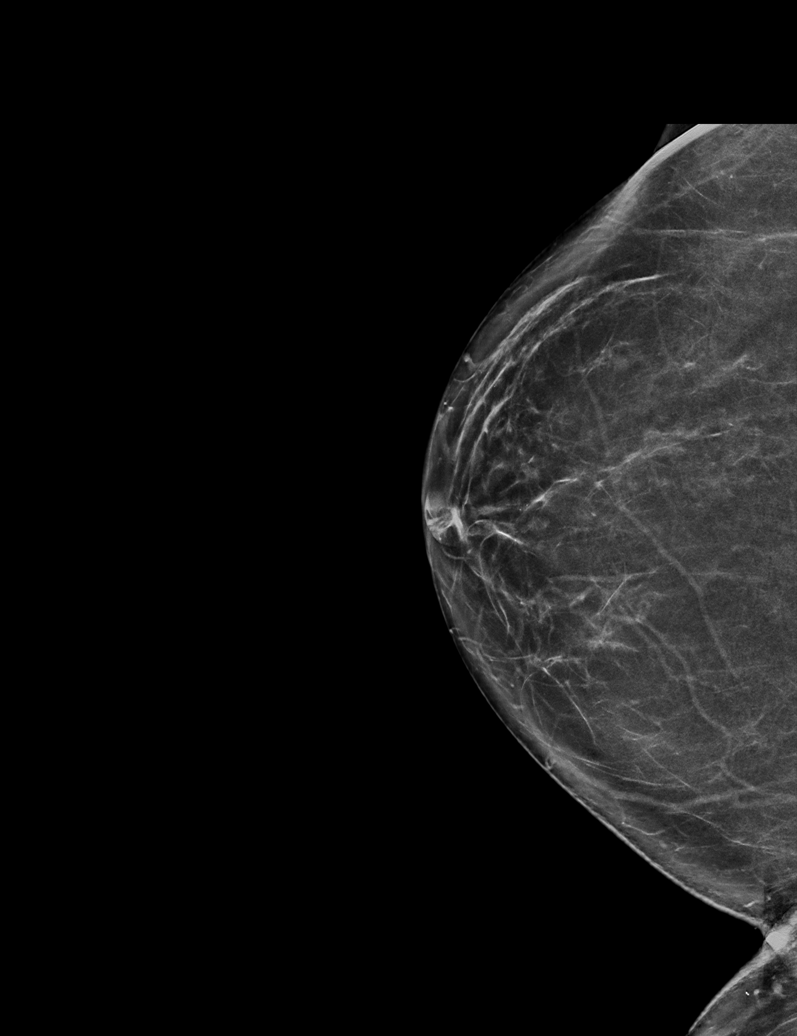

[L MLO synth-2D]
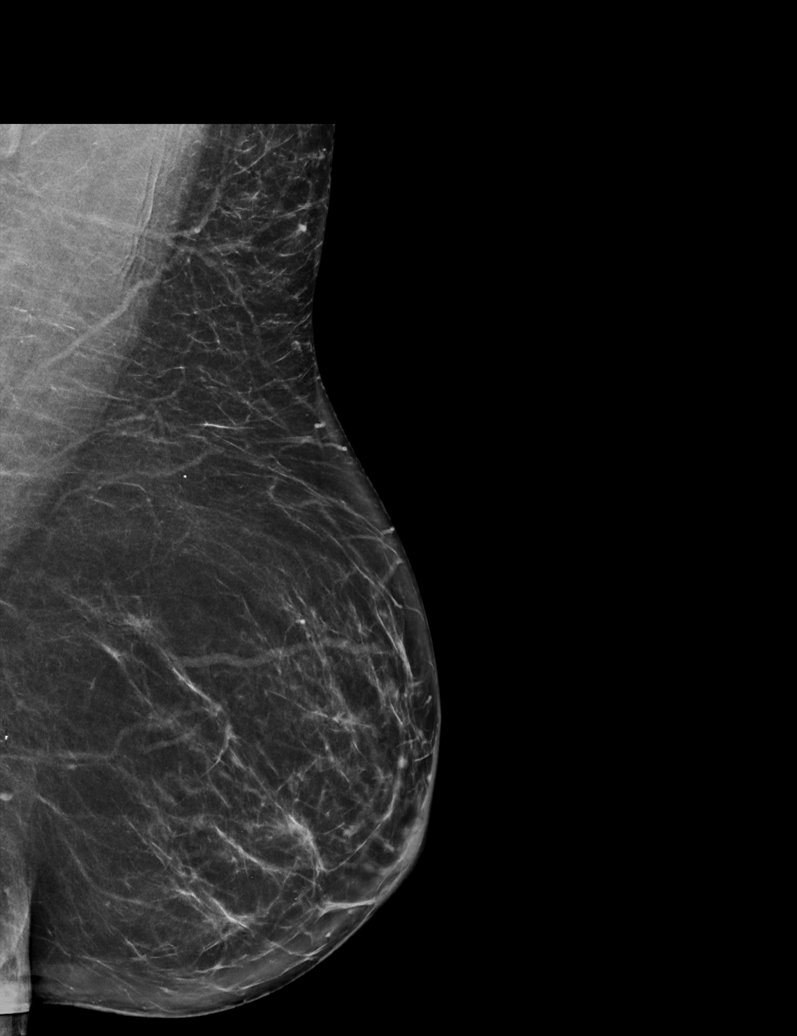

[R MLO tomo · tomo slice 33/66.0]
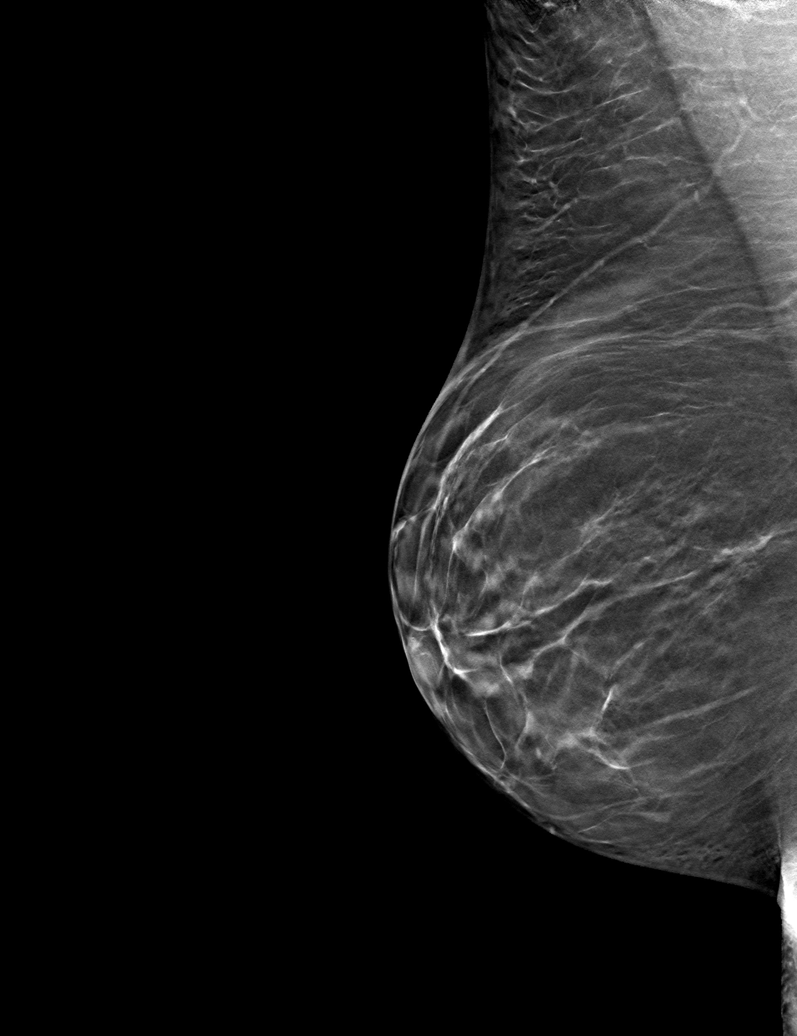

[6 of 30 positions shown; findings below may reference images not displayed]

ACR Breast Density Category b: There are scattered areas of
fibroglandular density.
FINDINGS: There are no findings suspicious for malignancy. The images were
evaluated with computer-aided detection.
IMPRESSION: No mammographic evidence of malignancy. A result letter of this
screening mammogram will be mailed directly to the patient.

RECOMMENDATION:
Screening mammogram in one year. (Code:[id number])

BI-RADS CATEGORY  1: Negative.

## 2023-04-11 DIAGNOSIS — Z0000 Encounter for general adult medical examination without abnormal findings: Secondary | ICD-10-CM | POA: Diagnosis not present

## 2023-04-11 DIAGNOSIS — Z1331 Encounter for screening for depression: Secondary | ICD-10-CM | POA: Diagnosis not present

## 2023-04-11 DIAGNOSIS — Z1339 Encounter for screening examination for other mental health and behavioral disorders: Secondary | ICD-10-CM | POA: Diagnosis not present

## 2023-05-30 DIAGNOSIS — E1165 Type 2 diabetes mellitus with hyperglycemia: Secondary | ICD-10-CM | POA: Diagnosis not present

## 2023-05-30 DIAGNOSIS — E039 Hypothyroidism, unspecified: Secondary | ICD-10-CM | POA: Diagnosis not present

## 2023-05-30 DIAGNOSIS — I152 Hypertension secondary to endocrine disorders: Secondary | ICD-10-CM | POA: Diagnosis not present

## 2023-06-02 DIAGNOSIS — E119 Type 2 diabetes mellitus without complications: Secondary | ICD-10-CM | POA: Diagnosis not present

## 2023-06-02 DIAGNOSIS — R7303 Prediabetes: Secondary | ICD-10-CM | POA: Diagnosis not present

## 2023-06-02 DIAGNOSIS — Z789 Other specified health status: Secondary | ICD-10-CM | POA: Diagnosis not present

## 2023-06-02 DIAGNOSIS — M1990 Unspecified osteoarthritis, unspecified site: Secondary | ICD-10-CM | POA: Diagnosis not present

## 2023-06-02 DIAGNOSIS — G8929 Other chronic pain: Secondary | ICD-10-CM | POA: Diagnosis not present

## 2023-06-02 DIAGNOSIS — E673 Hypervitaminosis D: Secondary | ICD-10-CM | POA: Diagnosis not present

## 2023-06-02 DIAGNOSIS — M2550 Pain in unspecified joint: Secondary | ICD-10-CM | POA: Diagnosis not present

## 2023-06-02 DIAGNOSIS — E782 Mixed hyperlipidemia: Secondary | ICD-10-CM | POA: Diagnosis not present

## 2023-06-02 DIAGNOSIS — I10 Essential (primary) hypertension: Secondary | ICD-10-CM | POA: Diagnosis not present

## 2023-06-02 DIAGNOSIS — E039 Hypothyroidism, unspecified: Secondary | ICD-10-CM | POA: Diagnosis not present

## 2023-09-06 DIAGNOSIS — M1990 Unspecified osteoarthritis, unspecified site: Secondary | ICD-10-CM | POA: Diagnosis not present

## 2023-09-06 DIAGNOSIS — I10 Essential (primary) hypertension: Secondary | ICD-10-CM | POA: Diagnosis not present

## 2023-09-06 DIAGNOSIS — E673 Hypervitaminosis D: Secondary | ICD-10-CM | POA: Diagnosis not present

## 2023-09-06 DIAGNOSIS — E1165 Type 2 diabetes mellitus with hyperglycemia: Secondary | ICD-10-CM | POA: Diagnosis not present

## 2023-09-18 DIAGNOSIS — E673 Hypervitaminosis D: Secondary | ICD-10-CM | POA: Diagnosis not present

## 2023-09-18 DIAGNOSIS — M1990 Unspecified osteoarthritis, unspecified site: Secondary | ICD-10-CM | POA: Diagnosis not present

## 2023-09-18 DIAGNOSIS — K219 Gastro-esophageal reflux disease without esophagitis: Secondary | ICD-10-CM | POA: Diagnosis not present

## 2023-09-18 DIAGNOSIS — J309 Allergic rhinitis, unspecified: Secondary | ICD-10-CM | POA: Diagnosis not present

## 2023-09-18 DIAGNOSIS — E1165 Type 2 diabetes mellitus with hyperglycemia: Secondary | ICD-10-CM | POA: Diagnosis not present

## 2023-09-18 DIAGNOSIS — I10 Essential (primary) hypertension: Secondary | ICD-10-CM | POA: Diagnosis not present

## 2023-11-08 DIAGNOSIS — D485 Neoplasm of uncertain behavior of skin: Secondary | ICD-10-CM | POA: Diagnosis not present

## 2023-11-08 DIAGNOSIS — C44319 Basal cell carcinoma of skin of other parts of face: Secondary | ICD-10-CM | POA: Diagnosis not present

## 2023-12-07 DIAGNOSIS — I469 Cardiac arrest, cause unspecified: Secondary | ICD-10-CM | POA: Diagnosis not present

## 2023-12-07 DIAGNOSIS — R404 Transient alteration of awareness: Secondary | ICD-10-CM | POA: Diagnosis not present

## 2023-12-07 DIAGNOSIS — R55 Syncope and collapse: Secondary | ICD-10-CM | POA: Diagnosis not present

## 2023-12-07 DIAGNOSIS — R0689 Other abnormalities of breathing: Secondary | ICD-10-CM | POA: Diagnosis not present

## 2023-12-22 DIAGNOSIS — 419620001 Death: Secondary | SNOMED CT | POA: Diagnosis not present

## 2023-12-22 DEATH — deceased
